# Patient Record
Sex: Male | Born: 1995
Health system: Southern US, Community
[De-identification: ages and names within clinical notes are randomized; demographics above are authoritative.]

## PROBLEM LIST (undated history)

## (undated) HISTORY — PX: TONSILLECTOMY: SUR1361

---
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
## 2013-02-08 ENCOUNTER — Encounter (HOSPITAL_BASED_OUTPATIENT_CLINIC_OR_DEPARTMENT_OTHER): Payer: Self-pay | Admitting: *Deleted

## 2013-02-08 ENCOUNTER — Emergency Department (HOSPITAL_BASED_OUTPATIENT_CLINIC_OR_DEPARTMENT_OTHER)
Admission: EM | Admit: 2013-02-08 | Discharge: 2013-02-08 | Disposition: A | Discharge status: Home / Self Care | Payer: Medicaid Other | Attending: Emergency Medicine | Admitting: Emergency Medicine

## 2013-02-08 ENCOUNTER — Emergency Department (HOSPITAL_BASED_OUTPATIENT_CLINIC_OR_DEPARTMENT_OTHER): Payer: Medicaid Other

## 2013-02-08 DIAGNOSIS — R296 Repeated falls: Secondary | ICD-10-CM | POA: Insufficient documentation

## 2013-02-08 DIAGNOSIS — Y9239 Other specified sports and athletic area as the place of occurrence of the external cause: Secondary | ICD-10-CM | POA: Insufficient documentation

## 2013-02-08 DIAGNOSIS — S59909A Unspecified injury of unspecified elbow, initial encounter: Secondary | ICD-10-CM | POA: Insufficient documentation

## 2013-02-08 DIAGNOSIS — S6990XA Unspecified injury of unspecified wrist, hand and finger(s), initial encounter: Secondary | ICD-10-CM | POA: Insufficient documentation

## 2013-02-08 DIAGNOSIS — T148XXA Other injury of unspecified body region, initial encounter: Secondary | ICD-10-CM

## 2013-02-08 DIAGNOSIS — S4991XA Unspecified injury of right shoulder and upper arm, initial encounter: Secondary | ICD-10-CM

## 2013-02-08 DIAGNOSIS — Y9367 Activity, basketball: Secondary | ICD-10-CM | POA: Insufficient documentation

## 2013-02-08 MED ORDER — HYDROCODONE-ACETAMINOPHEN 5-325 MG PO TABS: 1.0000 | ORAL_TABLET | ORAL | Status: DC | PRN

## 2013-02-08 MED ORDER — IBUPROFEN 800 MG PO TABS: 800.0000 mg | ORAL_TABLET | Freq: Three times a day (TID) | ORAL | Status: DC

## 2013-02-08 NOTE — ED Provider Notes (Signed)
TIME SEEN: 1:47 PM  CHIEF COMPLAINT: Right arm pain  HPI: Patient is an 17 year old male with no significant past medical history presents the emergency department with right arm injury. He states he was playing basketball at noon today when he stumbled and landed in the bleachers with his right arm. He initially had numbness of his right arm but that has resolved. He reports his pain is mostly in his right elbow and right proximal forearm but that it radiates into his fingertips. Describes the pain as throbbing, achy, moderate. Worse with flexion of the elbow. Better with ice. No focal weakness.  He did not hit his head. No neck pain or back pain.  ROS: See HPI Constitutional: no fever  Eyes: no drainage  ENT: no runny nose   Cardiovascular:  no chest pain  Resp: no SOB  GI: no vomiting GU: no dysuria Integumentary: no rash  Allergy: no hives  Musculoskeletal: no leg swelling  Neurological: no slurred speech ROS otherwise negative  PAST MEDICAL HISTORY/PAST SURGICAL HISTORY:  History reviewed. No pertinent past medical history.  MEDICATIONS:  Prior to Admission medications   Medication Sig Start Date End Date Taking? Authorizing Provider  diphenoxylate-atropine (LOMOTIL) 2.5-0.025 MG per tablet Take 2 tablets by mouth every 4 (four) hours as needed for diarrhea or loose stools. 11/30/12   Carleene Cooper III, MD  hydrOXYzine (ATARAX/VISTARIL) 25 MG tablet Take 1 or 2 tablets every 6 hours as needed for itching 03/03/12   Burgess Amor, PA-C  ketotifen (ALLERGY EYE DROPS) 0.025 % ophthalmic solution Place 1 drop into both eyes daily as needed. Allergies    Historical Provider, MD  predniSONE (DELTASONE) 10 MG tablet 6, 5, 4, 3, 2 then 1 tablet by mouth daily for 6 days total. 03/03/12   Burgess Amor, PA-C    ALLERGIES:  No Known Allergies  SOCIAL HISTORY:  History  Substance Use Topics  . Smoking status: Never Smoker   . Smokeless tobacco: Not on file  . Alcohol Use: No    FAMILY  HISTORY: Reviewed and not pertinent  EXAM: BP 132/97  Pulse 72  Temp(Src) 98.7 F (37.1 C) (Oral)  Resp 20  Wt 210 lb (95.255 kg)  BMI 31.94 kg/m2  SpO2 100% CONSTITUTIONAL: Alert and oriented and responds appropriately to questions. Well-appearing; well-nourished; GCS 15 HEAD: Normocephalic; atraumatic EYES: Conjunctivae clear, PERRL, EOMI ENT: normal nose; no rhinorrhea; moist mucous membranes; pharynx without lesions noted; no dental injury NECK: Supple, no meningismus, no LAD; no midline spinal tenderness, step-off or deformity CARD: RRR; S1 and S2 appreciated; no murmurs, no clicks, no rubs, no gallops RESP: Normal chest excursion without splinting or tachypnea; breath sounds clear and equal bilaterally; no wheezes, no rhonchi, no rales; chest wall stable, nontender to palpation ABD/GI: Normal bowel sounds; non-distended; soft, non-tender, no rebound, no guarding PELVIS:  stable, nontender to palpation BACK:  The back appears normal and is non-tender to palpation, there is no CVA tenderness; no midline spinal tenderness, step-off or deformity EXT: Tender to palpation over the proximal lateral right forearm and elbow with minimal amount of swelling, no ecchymosis, no joint effusion, Normal ROM in all joints; non-tender to palpation; no edema; normal capillary refill; no cyanosis; 2+ radial pulses bilaterally, sensation to light touch intact diffusely SKIN: Normal color for age and race; warm NEURO: Moves all extremities equally, normal gait, sensation to light touch intact diffusely, no facial droop, no slurred speech PSYCH: The patient's mood and manner are appropriate. Grooming and personal hygiene  are appropriate.  MEDICAL DECISION MAKING: Patient with injury to his right forearm and elbow. He is right-hand dominant. Neurovascular intact distally. Will obtain x-rays to evaluate for bony injury. Patient has full range of motion in all joints. No head injury.  ED PROGRESS: X-ray  shows no acute fracture. Discussed with patient this is likely contusion. He is neurovascularly intact currently. Has full range of motion in all joints. We'll discharge home with prescription for pain medication, instructions for rest, ice and elevation. Will give orthopedic followup as needed. Given usual and customary return precautions. Patient verbalized understanding and is comfortable with this plan.     Layla Maw Ward, DO 02/08/13 1424

## 2013-02-08 NOTE — ED Notes (Signed)
Was playing basketball and hit his right forearm on the bleachers. Arm has been painful and numb since.

## 2013-03-18 ENCOUNTER — Emergency Department (HOSPITAL_BASED_OUTPATIENT_CLINIC_OR_DEPARTMENT_OTHER)
Admission: EM | Admit: 2013-03-18 | Discharge: 2013-03-18 | Disposition: A | Discharge status: Home / Self Care | Payer: Medicaid Other | Attending: Emergency Medicine | Admitting: Emergency Medicine

## 2013-03-18 ENCOUNTER — Encounter (HOSPITAL_BASED_OUTPATIENT_CLINIC_OR_DEPARTMENT_OTHER): Payer: Self-pay | Admitting: Emergency Medicine

## 2013-03-18 DIAGNOSIS — R059 Cough, unspecified: Secondary | ICD-10-CM | POA: Insufficient documentation

## 2013-03-18 DIAGNOSIS — R51 Headache: Secondary | ICD-10-CM | POA: Insufficient documentation

## 2013-03-18 DIAGNOSIS — IMO0001 Reserved for inherently not codable concepts without codable children: Secondary | ICD-10-CM | POA: Insufficient documentation

## 2013-03-18 DIAGNOSIS — R05 Cough: Secondary | ICD-10-CM | POA: Insufficient documentation

## 2013-03-18 DIAGNOSIS — R509 Fever, unspecified: Secondary | ICD-10-CM | POA: Insufficient documentation

## 2013-03-18 DIAGNOSIS — J029 Acute pharyngitis, unspecified: Secondary | ICD-10-CM | POA: Insufficient documentation

## 2013-03-18 MED ORDER — IBUPROFEN 800 MG PO TABS
ORAL_TABLET | ORAL | Status: AC
  Filled 2013-03-18: qty 1

## 2013-03-18 MED ORDER — IBUPROFEN 800 MG PO TABS
800.0000 mg | ORAL_TABLET | Freq: Once | ORAL | Status: AC
  Administered 2013-03-18: 800 mg via ORAL

## 2013-03-18 NOTE — ED Notes (Signed)
Sore throat and fever x 24 hrs

## 2013-03-18 NOTE — ED Provider Notes (Signed)
CSN: [id number]     Arrival date & time 03/18/13  1858 History  This chart was scribed for Rolan Bucco, MD by Danella Maiers, ED Scribe. This patient was seen in room MH09/MH09 and the patient's care was started at 7:25 PM.   Chief Complaint  Patient presents with  . Sore Throat  . Fever   Patient is a 17 y.o. male presenting with pharyngitis and fever. The history is provided by the patient. No language interpreter was used.  Sore Throat This is a new problem. The current episode started 12 to 24 hours ago. The problem has been gradually worsening. Associated symptoms include headaches. Pertinent negatives include no chest pain, no abdominal pain and no shortness of breath. Nothing aggravates the symptoms.  Fever Associated symptoms: cough, headaches, myalgias and sore throat   Associated symptoms: no chest pain, no chills, no congestion, no diarrhea, no nausea, no rash, no rhinorrhea and no vomiting    HPI Comments: Ray Johnson is a 17 y.o. male who presents to the Emergency Department complaining of sore throat and fever for the past 24 hours with headache, myalgias, and mild cough. He states he has been feeling much worse since noon today and has been sleeping all day. His girlfriend has been sick with similar symptoms and was diagnosed with a URI.  Her symptoms have improved over the last few days.  He has not tried any OTC medications. He denies vomiting, diarrhea, rash, rhinorrhea, congestion.   History reviewed. No pertinent past medical history. Past Surgical History  Procedure Laterality Date  . Tonsillectomy     History reviewed. No pertinent family history. History  Substance Use Topics  . Smoking status: Never Smoker   . Smokeless tobacco: Not on file  . Alcohol Use: No    Review of Systems  Constitutional: Positive for fever. Negative for chills, diaphoresis and fatigue.  HENT: Positive for sore throat. Negative for congestion, rhinorrhea and sneezing.   Eyes:  Negative.   Respiratory: Positive for cough. Negative for chest tightness and shortness of breath.   Cardiovascular: Negative for chest pain and leg swelling.  Gastrointestinal: Negative for nausea, vomiting, abdominal pain, diarrhea and blood in stool.  Genitourinary: Negative for frequency, hematuria, flank pain and difficulty urinating.  Musculoskeletal: Positive for myalgias. Negative for arthralgias and back pain.  Skin: Negative for rash.  Neurological: Positive for headaches. Negative for dizziness, speech difficulty, weakness and numbness.    Allergies  Review of patient's allergies indicates no known allergies.  Home Medications  No current outpatient prescriptions on file. BP 119/78  Pulse 125  Temp(Src) 101.3 F (38.5 C) (Oral)  Resp 20  Wt 225 lb (102.059 kg)  SpO2 100% Physical Exam  Nursing note and vitals reviewed. Constitutional: He is oriented to person, place, and time. He appears well-developed and well-nourished.  HENT:  Head: Normocephalic and atraumatic.  Mouth/Throat: Uvula is midline.  Mild ery to posterior pharynx, no peritonsillar fullness  Eyes: Pupils are equal, round, and reactive to light.  Neck: Normal range of motion. Neck supple.  Cardiovascular: Normal rate, regular rhythm and normal heart sounds.   Pulmonary/Chest: Effort normal and breath sounds normal. No respiratory distress. He has no wheezes. He has no rales. He exhibits no tenderness.  Abdominal: Soft. Bowel sounds are normal. There is no tenderness. There is no rebound and no guarding.  Musculoskeletal: Normal range of motion. He exhibits no edema.  Lymphadenopathy:    He has no cervical adenopathy.  Neurological: He  is alert and oriented to person, place, and time.  Skin: Skin is warm and dry. No rash noted.  Psychiatric: He has a normal mood and affect.    ED Course  Procedures (including critical care time) Medications  ibuprofen (ADVIL,MOTRIN) tablet 800 mg (800 mg Oral Given  03/18/13 1942)   DIAGNOSTIC STUDIES: Oxygen Saturation is 100% on RA, normal by my interpretation.    COORDINATION OF CARE: 7:47 PM- Discussed treatment plan with pt which includes advising pt to alternate tylenol and motrin. Pt agrees to plan.    Labs Review Labs Reviewed  RAPID STREP SCREEN  CULTURE, GROUP A STREP   Imaging Review No results found.  EKG Interpretation   None       MDM   1. Pharyngitis    Patient is well-appearing. His symptoms are suggestive of pharyngitis. His rapid strep was negative likely indicating a viral infection however a culture was sent. He is still mildly tachycardic with a heart rate of 1:15 however I think that this is likely explained from the fever. He has no vomiting or signs of dehydration. He is well-appearing and there is no evidence suggestive of meningitis. He has no significant cough shortness of breath or hypoxia that would be suggestive of pneumonia. He was advised in symptomatic care and was advised to return if his symptoms worsen otherwise to followup with his primary care physician as needed     I personally performed the services described in this documentation, which was scribed in my presence.  The recorded information has been reviewed and considered.    Rolan Bucco, MD 03/18/13 2001

## 2013-03-20 LAB — CULTURE, GROUP A STREP

## 2013-07-23 ENCOUNTER — Encounter (HOSPITAL_BASED_OUTPATIENT_CLINIC_OR_DEPARTMENT_OTHER): Payer: Self-pay | Admitting: Emergency Medicine

## 2013-07-23 ENCOUNTER — Emergency Department (HOSPITAL_BASED_OUTPATIENT_CLINIC_OR_DEPARTMENT_OTHER): Payer: Medicaid Other

## 2013-07-23 ENCOUNTER — Emergency Department (HOSPITAL_BASED_OUTPATIENT_CLINIC_OR_DEPARTMENT_OTHER)
Admission: EM | Admit: 2013-07-23 | Discharge: 2013-07-23 | Disposition: A | Discharge status: Home / Self Care | Payer: Medicaid Other | Attending: Emergency Medicine | Admitting: Emergency Medicine

## 2013-07-23 DIAGNOSIS — X58XXXA Exposure to other specified factors, initial encounter: Secondary | ICD-10-CM | POA: Insufficient documentation

## 2013-07-23 DIAGNOSIS — F17200 Nicotine dependence, unspecified, uncomplicated: Secondary | ICD-10-CM | POA: Insufficient documentation

## 2013-07-23 DIAGNOSIS — Y939 Activity, unspecified: Secondary | ICD-10-CM | POA: Insufficient documentation

## 2013-07-23 DIAGNOSIS — S39012A Strain of muscle, fascia and tendon of lower back, initial encounter: Secondary | ICD-10-CM

## 2013-07-23 DIAGNOSIS — Y929 Unspecified place or not applicable: Secondary | ICD-10-CM | POA: Insufficient documentation

## 2013-07-23 DIAGNOSIS — S335XXA Sprain of ligaments of lumbar spine, initial encounter: Secondary | ICD-10-CM | POA: Insufficient documentation

## 2013-07-23 NOTE — ED Provider Notes (Signed)
CSN: [id number]     Arrival date & time 07/23/13  1817 History   First MD Initiated Contact with Patient 07/23/13 1915     Chief Complaint  Patient presents with  . Back Pain     (Consider location/radiation/quality/duration/timing/severity/associated sxs/prior Treatment) HPI Comments: Patient here with mid to lower back pain - states pain started on Monday and has progressively gotten worse - saw his PCP yesterday and was started on Mobic and flexeril which he does not believe is helping his pain - he denies fever, chills, injury to the back, weakness, numbness, tingling, loss of control of bowel or bladder.  Patient is a 18 y.o. male presenting with back pain. The history is provided by the patient. No language interpreter was used.  Back Pain Location:  Lumbar spine Quality:  Aching Radiates to:  Does not radiate Pain severity:  Moderate Pain is:  Same all the time Onset quality:  Gradual Duration:  5 days Timing:  Constant Progression:  Worsening Chronicity:  New Context: not jumping from heights, not lifting heavy objects, not occupational injury, not pedestrian accident, not physical stress, not recent illness and not twisting   Relieved by:  Nothing Worsened by:  Movement Ineffective treatments:  Ibuprofen and muscle relaxants Associated symptoms: no abdominal pain, no bladder incontinence, no bowel incontinence, no dysuria, no fever, no headaches, no leg pain, no pelvic pain, no tingling, no weakness and no weight loss     History reviewed. No pertinent past medical history. Past Surgical History  Procedure Laterality Date  . Tonsillectomy     No family history on file. History  Substance Use Topics  . Smoking status: Current Some Day Smoker  . Smokeless tobacco: Not on file  . Alcohol Use: No    Review of Systems  Constitutional: Negative for fever and weight loss.  Gastrointestinal: Negative for abdominal pain and bowel incontinence.  Genitourinary: Negative  for bladder incontinence, dysuria and pelvic pain.  Musculoskeletal: Positive for back pain.  Neurological: Negative for tingling, weakness and headaches.  All other systems reviewed and are negative.      Allergies  Review of patient's allergies indicates no known allergies.  Home Medications  No current outpatient prescriptions on file. BP 130/87  Pulse 80  Temp(Src) 98 F (36.7 C)  Resp 18  Ht 5\' 7"  (1.702 m)  Wt 220 lb (99.791 kg)  BMI 34.45 kg/m2  SpO2 99% Physical Exam  Nursing note and vitals reviewed. Constitutional: He is oriented to person, place, and time. He appears well-developed and well-nourished. No distress.  HENT:  Head: Normocephalic and atraumatic.  Mouth/Throat: Oropharynx is clear and moist.  Eyes: Conjunctivae are normal. No scleral icterus.  Neck: Normal range of motion. Neck supple. No spinous process tenderness and no muscular tenderness present.  Pulmonary/Chest: Effort normal.  Musculoskeletal: Normal range of motion.       Thoracic back: He exhibits normal range of motion, no tenderness and no bony tenderness.       Lumbar back: He exhibits tenderness and bony tenderness. He exhibits normal range of motion, no pain and no spasm.       Back:  Lymphadenopathy:    He has no cervical adenopathy.  Neurological: He is alert and oriented to person, place, and time. He displays no atrophy. He exhibits normal muscle tone. He displays a negative Romberg sign. Coordination and gait normal. He displays no Babinski's sign on the right side. He displays no Babinski's sign on the left side.  Reflex Scores:      Patellar reflexes are 2+ on the right side and 2+ on the left side.      Achilles reflexes are 2+ on the right side and 2+ on the left side. 5/5 strength in bilateral hip flexors, quads, hamstrings  Skin: Skin is warm and dry. No rash noted. No erythema. No pallor.  Psychiatric: He has a normal mood and affect. His behavior is normal. Judgment and  thought content normal.    ED Course  Procedures (including critical care time) Labs Review Labs Reviewed - No data to display Imaging Review Dg Lumbar Spine Complete  07/23/2013   CLINICAL DATA:  Low back pain  EXAM: LUMBAR SPINE - COMPLETE 4+ VIEW  COMPARISON:  March 30, 2011  FINDINGS: Frontal, lateral, spot lumbosacral lateral, and bilateral oblique views were obtained. There are 5 non-rib-bearing lumbar type vertebral bodies. There is no fracture or spondylolisthesis. Disc spaces appear intact. There are pars defects at L4 bilaterally. There is no appreciable facet arthropathy.  IMPRESSION: Pars defects at L4 bilaterally. No fracture or spondylolisthesis. No appreciable arthropathy.   Electronically Signed   By: Bretta BangWilliam  Woodruff M.D.   On: 07/23/2013 19:59     EKG Interpretation None      MDM   Lower back pain  Patient here with lower back pain - has been seeing PCP for this - x-ray shows bilateral pars defects wtihout spondylolisthesis, results discussed with the patient, believe this to be more muscular in nature.  No alarming signs to suggest cauda equina or epidural hematoma.   Izola PriceFrances C. Marisue HumbleSanford, New JerseyPA-C 07/23/13 2113

## 2013-07-23 NOTE — Discharge Instructions (Signed)
Back Exercises °Back exercises help treat and prevent back injuries. The goal of back exercises is to increase the strength of your abdominal and back muscles and the flexibility of your back. These exercises should be started when you no longer have back pain. Back exercises include: °· Pelvic Tilt. Lie on your back with your knees bent. Tilt your pelvis until the lower part of your back is against the floor. Hold this position 5 to 10 sec and repeat 5 to 10 times. °· Knee to Chest. Pull first 1 knee up against your chest and hold for 20 to 30 seconds, repeat this with the other knee, and then both knees. This may be done with the other leg straight or bent, whichever feels better. °· Sit-Ups or Curl-Ups. Bend your knees 90 degrees. Start with tilting your pelvis, and do a partial, slow sit-up, lifting your trunk only 30 to 45 degrees off the floor. Take at least 2 to 3 seconds for each sit-up. Do not do sit-ups with your knees out straight. If partial sit-ups are difficult, simply do the above but with only tightening your abdominal muscles and holding it as directed. °· Hip-Lift. Lie on your back with your knees flexed 90 degrees. Push down with your feet and shoulders as you raise your hips a couple inches off the floor; hold for 10 seconds, repeat 5 to 10 times. °· Back arches. Lie on your stomach, propping yourself up on bent elbows. Slowly press on your hands, causing an arch in your low back. Repeat 3 to 5 times. Any initial stiffness and discomfort should lessen with repetition over time. °· Shoulder-Lifts. Lie face down with arms beside your body. Keep hips and torso pressed to floor as you slowly lift your head and shoulders off the floor. °Do not overdo your exercises, especially in the beginning. Exercises may cause you some mild back discomfort which lasts for a few minutes; however, if the pain is more severe, or lasts for more than 15 minutes, do not continue exercises until you see your caregiver.  Improvement with exercise therapy for back problems is slow.  °See your caregivers for assistance with developing a proper back exercise program. °Document Released: 06/05/2004 Document Revised: 07/21/2011 Document Reviewed: 02/27/2011 °[employer]® Patient Information ©2014 [employer]. ° °Back Pain, Adult °Low back pain is very common. About 1 in 5 people have back pain. The cause of low back pain is rarely dangerous. The pain often gets better over time. About half of people with a sudden onset of back pain feel better in just 2 weeks. About 8 in 10 people feel better by 6 weeks.  °CAUSES °Some common causes of back pain include: °· Strain of the muscles or ligaments supporting the spine. °· Wear and tear (degeneration) of the spinal discs. °· Arthritis. °· Direct injury to the back. °DIAGNOSIS °Most of the time, the direct cause of low back pain is not known. However, back pain can be treated effectively even when the exact cause of the pain is unknown. Answering your caregiver's questions about your overall health and symptoms is one of the most accurate ways to make sure the cause of your pain is not dangerous. If your caregiver needs more information, he or she may order lab work or imaging tests (X-rays or MRIs). However, even if imaging tests show changes in your back, this usually does not require surgery. °HOME CARE INSTRUCTIONS °For many people, back pain returns. Since low back pain is rarely dangerous, it is often a condition that people   can learn to manage on their own.  °· Remain active. It is stressful on the back to sit or stand in one place. Do not sit, drive, or stand in one place for more than 30 minutes at a time. Take short walks on level surfaces as soon as pain allows. Try to increase the length of time you walk each day. °· Do not stay in bed. Resting more than 1 or 2 days can delay your recovery. °· Do not avoid exercise or work. Your body is made to move. It is not dangerous to be active,  even though your back may hurt. Your back will likely heal faster if you return to being active before your pain is gone. °· Pay attention to your body when you  bend and lift. Many people have less discomfort when lifting if they bend their knees, keep the load close to their bodies, and avoid twisting. Often, the most comfortable positions are those that put less stress on your recovering back. °· Find a comfortable position to sleep. Use a firm mattress and lie on your side with your knees slightly bent. If you lie on your back, put a pillow under your knees. °· Only take over-the-counter or prescription medicines as directed by your caregiver. Over-the-counter medicines to reduce pain and inflammation are often the most helpful. Your caregiver may prescribe muscle relaxant drugs. These medicines help dull your pain so you can more quickly return to your normal activities and healthy exercise. °· Put ice on the injured area. °· Put ice in a plastic bag. °· Place a towel between your skin and the bag. °· Leave the ice on for 15-20 minutes, 03-04 times a day for the first 2 to 3 days. After that, ice and heat may be alternated to reduce pain and spasms. °· Ask your caregiver about trying back exercises and gentle massage. This may be of some benefit. °· Avoid feeling anxious or stressed. Stress increases muscle tension and can worsen back pain. It is important to recognize when you are anxious or stressed and learn ways to manage it. Exercise is a great option. °SEEK MEDICAL CARE IF: °· You have pain that is not relieved with rest or medicine. °· You have pain that does not improve in 1 week. °· You have new symptoms. °· You are generally not feeling well. °SEEK IMMEDIATE MEDICAL CARE IF:  °· You have pain that radiates from your back into your legs. °· You develop new bowel or bladder control problems. °· You have unusual weakness or numbness in your arms or legs. °· You develop nausea or vomiting. °· You develop  abdominal pain. °· You feel faint. °Document Released: 04/28/2005 Document Revised: 10/28/2011 Document Reviewed: 09/16/2010 °Exit[employer]® Patient Information ©2014 [employer]. ° °Lumbosacral Strain °Lumbosacral strain is a strain of any of the parts that make up your lumbosacral vertebrae. Your lumbosacral vertebrae are the bones that make up the lower third of your backbone. Your lumbosacral vertebrae are held together by muscles and tough, fibrous tissue (ligaments).  °CAUSES  °A sudden blow to your back can cause lumbosacral strain. Also, anything that causes an excessive stretch of the muscles in the low back can cause this strain. This is typically seen when people exert themselves strenuously, fall, lift heavy objects, bend, or crouch repeatedly. °RISK FACTORS °· Physically demanding work. °· Participation in pushing or pulling sports or sports that require sudden twist of the back (tennis, golf, baseball). °· Weight lifting. °· Excessive lower back curvature. °· Forward-tilted pelvis. °· Weak back or abdominal muscles or both. °·   Tight hamstrings. °SIGNS AND SYMPTOMS  °Lumbosacral strain may cause pain in the area of your injury or pain that moves (radiates) down your leg.  °DIAGNOSIS °Your health care provider can often diagnose lumbosacral strain through a physical exam. In some cases, you may need tests such as X-ray exams.  °TREATMENT  °Treatment for your lower back injury depends on many factors that your clinician will have to evaluate. However, most treatment will include the use of anti-inflammatory medicines. °HOME CARE INSTRUCTIONS  °· Avoid hard physical activities (tennis, racquetball, waterskiing) if you are not in proper physical condition for it. This may aggravate or create problems. °· If you have a back problem, avoid sports requiring sudden body movements. Swimming and walking are generally safer activities. °· Maintain good posture. °· Maintain a healthy weight. °· For acute conditions,  you may put ice on the injured area. °· Put ice in a plastic bag. °· Place a towel between your skin and the bag. °· Leave the ice on for 20 minutes, 2 3 times a day. °· When the low back starts healing, stretching and strengthening exercises may be recommended. °SEEK MEDICAL CARE IF: °· Your back pain is getting worse. °· You experience severe back pain not relieved with medicines. °SEEK IMMEDIATE MEDICAL CARE IF:  °· You have numbness, tingling, weakness, or problems with the use of your arms or legs. °· There is a change in bowel or bladder control. °· You have increasing pain in any area of the body, including your belly (abdomen). °· You notice shortness of breath, dizziness, or feel faint. °· You feel sick to your stomach (nauseous), are throwing up (vomiting), or become sweaty. °· You notice discoloration of your toes or legs, or your feet get very cold. °MAKE SURE YOU:  °· Understand these instructions. °· Will watch your condition. °· Will get help right away if you are not doing well or get worse. °Document Released: 02/05/2005 Document Revised: 02/16/2013 Document Reviewed: 12/15/2012 °ExitCare® Patient Information ©2014 [employer]. ° °

## 2013-07-23 NOTE — ED Notes (Signed)
C/o mid/low back pain since Monday.  States pain is radiating up his spine into his chest.  Denies chest pain.  Does c/o some shortness of breath.  Denies prior history of an episode like this, does not recall an injury or causative factor for his back pain.

## 2013-07-24 NOTE — ED Provider Notes (Signed)
Medical screening examination/treatment/procedure(s) were performed by non-physician practitioner and as supervising physician I was immediately available for consultation/collaboration.   EKG Interpretation None        Audree CamelScott T Mare Ludtke, MD 07/24/13 1201

## 2014-01-21 ENCOUNTER — Encounter (HOSPITAL_BASED_OUTPATIENT_CLINIC_OR_DEPARTMENT_OTHER): Payer: Self-pay | Admitting: Emergency Medicine

## 2014-01-21 ENCOUNTER — Emergency Department (HOSPITAL_BASED_OUTPATIENT_CLINIC_OR_DEPARTMENT_OTHER)
Admission: EM | Admit: 2014-01-21 | Discharge: 2014-01-21 | Disposition: A | Discharge status: Home / Self Care | Payer: Medicaid Other | Attending: Emergency Medicine | Admitting: Emergency Medicine

## 2014-01-21 DIAGNOSIS — S3981XA Other specified injuries of abdomen, initial encounter: Secondary | ICD-10-CM | POA: Insufficient documentation

## 2014-01-21 DIAGNOSIS — Y9239 Other specified sports and athletic area as the place of occurrence of the external cause: Secondary | ICD-10-CM | POA: Insufficient documentation

## 2014-01-21 DIAGNOSIS — Y9364 Activity, baseball: Secondary | ICD-10-CM | POA: Diagnosis not present

## 2014-01-21 DIAGNOSIS — Y92838 Other recreation area as the place of occurrence of the external cause: Secondary | ICD-10-CM | POA: Diagnosis not present

## 2014-01-21 DIAGNOSIS — F17200 Nicotine dependence, unspecified, uncomplicated: Secondary | ICD-10-CM | POA: Insufficient documentation

## 2014-01-21 DIAGNOSIS — W219XXA Striking against or struck by unspecified sports equipment, initial encounter: Secondary | ICD-10-CM | POA: Insufficient documentation

## 2014-01-21 DIAGNOSIS — Z79899 Other long term (current) drug therapy: Secondary | ICD-10-CM | POA: Diagnosis not present

## 2014-01-21 DIAGNOSIS — Z791 Long term (current) use of non-steroidal anti-inflammatories (NSAID): Secondary | ICD-10-CM | POA: Insufficient documentation

## 2014-01-21 DIAGNOSIS — S76211S Strain of adductor muscle, fascia and tendon of right thigh, sequela: Secondary | ICD-10-CM

## 2014-01-21 DIAGNOSIS — IMO0002 Reserved for concepts with insufficient information to code with codable children: Secondary | ICD-10-CM | POA: Insufficient documentation

## 2014-01-21 MED ORDER — NAPROXEN 500 MG PO TABS: 500.0000 mg | ORAL_TABLET | Freq: Two times a day (BID) | ORAL | Status: DC

## 2014-01-21 MED ORDER — METHOCARBAMOL 500 MG PO TABS: 500.0000 mg | ORAL_TABLET | Freq: Two times a day (BID) | ORAL | Status: DC

## 2014-01-21 NOTE — ED Provider Notes (Signed)
CSN: [id number]     Arrival date & time 01/21/14  1354 History   This chart was scribed for Rolland Porter, MD by Modena Jansky, ED Scribe. This patient was seen in room MH11/MH11 and the patient's care was started at 2:55 PM.   Chief Complaint  Patient presents with  . Groin Pain   The history is provided by the patient. No language interpreter was used.   HPI Comments: Ray Johnson is a 18 y.o. male who presents to the Emergency Department complaining of constant moderate right groin pain that started this morning. He states that he was playing softball last night aran from 2nd to third base.  Felt a pull and pain in his right groin, up toward his right testicle. Limping today.  No fall or slide.  History reviewed. No pertinent past medical history. Past Surgical History  Procedure Laterality Date  . Tonsillectomy     No family history on file. History  Substance Use Topics  . Smoking status: Current Some Day Smoker  . Smokeless tobacco: Not on file  . Alcohol Use: No    Review of Systems  Constitutional: Negative for fever, chills, diaphoresis, appetite change and fatigue.  HENT: Negative for mouth sores, sore throat and trouble swallowing.   Eyes: Negative for visual disturbance.  Respiratory: Negative for cough, chest tightness, shortness of breath and wheezing.   Cardiovascular: Negative for chest pain.  Gastrointestinal: Negative for nausea, vomiting, abdominal pain, diarrhea and abdominal distention.  Endocrine: Negative for polydipsia, polyphagia and polyuria.  Genitourinary: Negative for dysuria, frequency and hematuria.  Musculoskeletal: Positive for myalgias. Negative for gait problem.  Skin: Negative for color change, pallor and rash.  Neurological: Negative for dizziness, syncope, light-headedness and headaches.  Hematological: Does not bruise/bleed easily.  Psychiatric/Behavioral: Negative for behavioral problems and confusion.    Allergies  Review of  patient's allergies indicates no known allergies.  Home Medications   Prior to Admission medications   Medication Sig Start Date End Date Taking? Authorizing Provider  methocarbamol (ROBAXIN) 500 MG tablet Take 1 tablet (500 mg total) by mouth 2 (two) times daily. 01/21/14   Rolland Porter, MD  naproxen (NAPROSYN) 500 MG tablet Take 1 tablet (500 mg total) by mouth 2 (two) times daily. 01/21/14   Rolland Porter, MD   BP 124/74  Temp(Src) 98 F (36.7 C)  Resp 16  Ht  (1.676 m)  Wt 225 lb (102.059 kg)  BMI 36.33 kg/m2  SpO2 100% Physical Exam  Nursing note and vitals reviewed. Constitutional: He is oriented to person, place, and time. He appears well-developed and well-nourished. No distress.  HENT:  Head: Normocephalic.  Eyes: Conjunctivae are normal. Pupils are equal, round, and reactive to light. No scleral icterus.  Neck: Normal range of motion. Neck supple. No thyromegaly present.  Cardiovascular: Normal rate and regular rhythm.  Exam reveals no gallop and no friction rub.   No murmur heard. Pulmonary/Chest: Effort normal and breath sounds normal. No respiratory distress. He has no wheezes. He has no rales.  Abdominal: Soft. Bowel sounds are normal. He exhibits no distension. There is no tenderness. There is no rebound.  Musculoskeletal: Normal range of motion.  Tenderness along adductors, to  Pubic symphysis.  No ecymosis.  Pain with resisted aDduction, and with passive aBduction.    Normal scrotum and testicle.  Neurological: He is alert and oriented to person, place, and time.  Skin: Skin is warm and dry. No rash noted.  Psychiatric: He has a  normal mood and affect. His behavior is normal.    ED Course  Procedures (including critical care time) DIAGNOSTIC STUDIES: Oxygen Saturation is 100% on RA, normal by my interpretation.    COORDINATION OF CARE: 2:55 PM- Pt advised of plan for treatment and pt agrees.  Labs Review Labs Reviewed - No data to display  Imaging  Review No results found.   EKG Interpretation None      MDM   Final diagnoses:  Strain of adductor magnus muscle, right, sequela    I personally performed the services described in this documentation, which was scribed in my presence. The recorded information has been reviewed and is accurate.     Rolland Porter, MD 01/25/14 1455

## 2014-01-21 NOTE — Discharge Instructions (Signed)
Gentle stretching.    Avoid strenuous activity, or sports until pain improved.

## 2014-01-21 NOTE — ED Notes (Signed)
Pt reports playing softball last night, slid in to third base and today woke up with pain to right groin area. Unsure if pulled muscle.

## 2014-08-03 ENCOUNTER — Emergency Department (HOSPITAL_BASED_OUTPATIENT_CLINIC_OR_DEPARTMENT_OTHER)
Admission: EM | Admit: 2014-08-03 | Discharge: 2014-08-03 | Disposition: A | Discharge status: Home / Self Care | Payer: Medicaid Other | Attending: Emergency Medicine | Admitting: Emergency Medicine

## 2014-08-03 ENCOUNTER — Encounter (HOSPITAL_BASED_OUTPATIENT_CLINIC_OR_DEPARTMENT_OTHER): Payer: Self-pay | Admitting: Emergency Medicine

## 2014-08-03 DIAGNOSIS — Z79899 Other long term (current) drug therapy: Secondary | ICD-10-CM | POA: Insufficient documentation

## 2014-08-03 DIAGNOSIS — Z720 Tobacco use: Secondary | ICD-10-CM | POA: Diagnosis not present

## 2014-08-03 DIAGNOSIS — R1084 Generalized abdominal pain: Secondary | ICD-10-CM | POA: Diagnosis present

## 2014-08-03 DIAGNOSIS — K529 Noninfective gastroenteritis and colitis, unspecified: Secondary | ICD-10-CM | POA: Diagnosis not present

## 2014-08-03 DIAGNOSIS — Z791 Long term (current) use of non-steroidal anti-inflammatories (NSAID): Secondary | ICD-10-CM | POA: Insufficient documentation

## 2014-08-03 LAB — BASIC METABOLIC PANEL
ANION GAP: 7 (ref 5–15)
BUN: 16 mg/dL (ref 6–23)
CHLORIDE: 107 mmol/L (ref 96–112)
CO2: 25 mmol/L (ref 19–32)
CREATININE: 0.95 mg/dL (ref 0.50–1.35)
Calcium: 9 mg/dL (ref 8.4–10.5)
GFR calc Af Amer: >90 mL/min (ref >90)
GFR calc non Af Amer: >90 mL/min (ref >90)
Glucose, Bld: 105 mg/dL — ABNORMAL HIGH (ref 70–99)
Potassium: 4.1 mmol/L (ref 3.5–5.1)
Sodium: 139 mmol/L (ref 135–145)

## 2014-08-03 LAB — CBC WITH DIFFERENTIAL/PLATELET
BASOS PCT: 0 % (ref 0–1)
Basophils Absolute: 0 10*3/uL (ref 0.0–0.1)
Eosinophils Absolute: 0.3 10*3/uL (ref 0.0–0.7)
Eosinophils Relative: 4 % (ref 0–5)
HEMATOCRIT: 43.7 % (ref 39.0–52.0)
HEMOGLOBIN: 14.7 g/dL (ref 13.0–17.0)
LYMPHS ABS: 2.2 10*3/uL (ref 0.7–4.0)
LYMPHS PCT: 27 % (ref 12–46)
MCH: 29.3 pg (ref 26.0–34.0)
MCHC: 33.6 g/dL (ref 30.0–36.0)
MCV: 87.1 fL (ref 78.0–100.0)
MONO ABS: 1.3 10*3/uL — AB (ref 0.1–1.0)
MONOS PCT: 17 % — AB (ref 3–12)
NEUTROS ABS: 4.1 10*3/uL (ref 1.7–7.7)
NEUTROS PCT: 52 % (ref 43–77)
Platelets: 244 10*3/uL (ref 150–400)
RBC: 5.02 MIL/uL (ref 4.22–5.81)
RDW: 12.6 % (ref 11.5–15.5)
WBC: 7.9 10*3/uL (ref 4.0–10.5)

## 2014-08-03 MED ORDER — ONDANSETRON 8 MG PO TBDP: ORAL_TABLET | ORAL | Status: DC

## 2014-08-03 MED ORDER — KETOROLAC TROMETHAMINE 30 MG/ML IJ SOLN
30.0000 mg | Freq: Once | INTRAMUSCULAR | Status: AC
  Administered 2014-08-03: 30 mg via INTRAVENOUS
  Filled 2014-08-03: qty 1

## 2014-08-03 MED ORDER — ONDANSETRON HCL 4 MG/2ML IJ SOLN
4.0000 mg | Freq: Once | INTRAMUSCULAR | Status: AC
  Administered 2014-08-03: 4 mg via INTRAVENOUS
  Filled 2014-08-03: qty 2

## 2014-08-03 MED ORDER — SODIUM CHLORIDE 0.9 % IV BOLUS (SEPSIS)
1000.0000 mL | Freq: Once | INTRAVENOUS | Status: AC
  Administered 2014-08-03: 1000 mL via INTRAVENOUS

## 2014-08-03 NOTE — Discharge Instructions (Signed)
Zofran as prescribed as needed for nausea. ° °Clear liquid diet for the next 12 hours, then slowly advance to normal. ° °Return to the emergency department for severe abdominal pain, bloody stool, or other new and concerning symptoms. ° ° °Viral Gastroenteritis °Viral gastroenteritis is also known as stomach flu. This condition affects the stomach and intestinal tract. It can cause sudden diarrhea and vomiting. The illness typically lasts 3 to 8 days. Most people develop an immune response that eventually gets rid of the virus. While this natural response develops, the virus can make you quite ill. °CAUSES  °Many different viruses can cause gastroenteritis, such as rotavirus or noroviruses. You can catch one of these viruses by consuming contaminated food or water. You may also catch a virus by sharing utensils or other personal items with an infected person or by touching a contaminated surface. °SYMPTOMS  °The most common symptoms are diarrhea and vomiting. These problems can cause a severe loss of body fluids (dehydration) and a body salt (electrolyte) imbalance. Other symptoms may include: °· Fever. °· Headache. °· Fatigue. °· Abdominal pain. °DIAGNOSIS  °Your caregiver can usually diagnose viral gastroenteritis based on your symptoms and a physical exam. A stool sample may also be taken to test for the presence of viruses or other infections. °TREATMENT  °This illness typically goes away on its own. Treatments are aimed at rehydration. The most serious cases of viral gastroenteritis involve vomiting so severely that you are not able to keep fluids down. In these cases, fluids must be given through an intravenous line (IV). °HOME CARE INSTRUCTIONS  °· Drink enough fluids to keep your urine clear or pale yellow. Drink small amounts of fluids frequently and increase the amounts as tolerated. °· Ask your caregiver for specific rehydration instructions. °· Avoid: °¨ Foods high in sugar. °¨ Alcohol. °¨ Carbonated  drinks. °¨ Tobacco. °¨ Juice. °¨ Caffeine drinks. °¨ Extremely hot or cold fluids. °¨ Fatty, greasy foods. °¨ Too much intake of anything at one time. °¨ Dairy products until 24 to 48 hours after diarrhea stops. °· You may consume probiotics. Probiotics are active cultures of beneficial bacteria. They may lessen the amount and number of diarrheal stools in adults. Probiotics can be found in yogurt with active cultures and in supplements. °· Wash your hands well to avoid spreading the virus. °· Only take over-the-counter or prescription medicines for pain, discomfort, or fever as directed by your caregiver. Do not give aspirin to children. Antidiarrheal medicines are not recommended. °· Ask your caregiver if you should continue to take your regular prescribed and over-the-counter medicines. °· Keep all follow-up appointments as directed by your caregiver. °SEEK IMMEDIATE MEDICAL CARE IF:  °· You are unable to keep fluids down. °· You do not urinate at least once every 6 to 8 hours. °· You develop shortness of breath. °· You notice blood in your stool or vomit. This may look like coffee grounds. °· You have abdominal pain that increases or is concentrated in one small area (localized). °· You have persistent vomiting or diarrhea. °· You have a fever. °· The patient is a child younger than 3 months, and he or she has a fever. °· The patient is a child older than 3 months, and he or she has a fever and persistent symptoms. °· The patient is a child older than 3 months, and he or she has a fever and symptoms suddenly get worse. °· The patient is a baby, and he or she has   no tears when crying. °MAKE SURE YOU:  °· Understand these instructions. °· Will watch your condition. °· Will get help right away if you are not doing well or get worse. °Document Released: 04/28/2005 Document Revised: 07/21/2011 Document Reviewed: 02/12/2011 °[employer]® Patient Information ©2015 [employer]. This information is not intended to replace  advice given to you by your health care provider. Make sure you discuss any questions you have with your health care provider. ° °

## 2014-08-03 NOTE — ED Provider Notes (Signed)
CSN: [id number]     Arrival date & time 08/03/14  0650 History   First MD Initiated Contact with Patient 08/03/14 0715     Chief Complaint  Patient presents with  . Abdominal Pain     (Consider location/radiation/quality/duration/timing/severity/associated sxs/prior Treatment) HPI Comments: Patient presents with a 2 day history of nausea, vomiting, diarrhea. Several family members are ill in a similar fashion. He is also reporting some abdominal cramping but denies fever. All stool has been nonbloody.  Patient is a 19 y.o. male presenting with abdominal pain. The history is provided by the patient.  Abdominal Pain Pain location:  Generalized Pain quality: cramping   Pain radiates to:  Does not radiate Pain severity:  Moderate Onset quality:  Sudden Duration:  2 days Timing:  Constant Progression:  Worsening Chronicity:  New Relieved by:  Nothing Worsened by:  Nothing tried Ineffective treatments:  None tried   History reviewed. No pertinent past medical history. Past Surgical History  Procedure Laterality Date  . Tonsillectomy     History reviewed. No pertinent family history. History  Substance Use Topics  . Smoking status: Current Some Day Smoker  . Smokeless tobacco: Not on file  . Alcohol Use: No    Review of Systems  Gastrointestinal: Positive for abdominal pain.  All other systems reviewed and are negative.     Allergies  Review of patient's allergies indicates no known allergies.  Home Medications   Prior to Admission medications   Medication Sig Start Date End Date Taking? Authorizing Provider  methocarbamol (ROBAXIN) 500 MG tablet Take 1 tablet (500 mg total) by mouth 2 (two) times daily. 01/21/14   Rolland PorterMark James, MD  naproxen (NAPROSYN) 500 MG tablet Take 1 tablet (500 mg total) by mouth 2 (two) times daily. 01/21/14   Rolland PorterMark James, MD   BP 140/73 mmHg  Pulse 73  Temp(Src) 98.1 F (36.7 C) (Oral)  Resp 16  Ht 5\' 7"  (1.702 m)  Wt 225 lb (102.059 kg)   BMI 35.23 kg/m2  SpO2 99% Physical Exam  Constitutional: He is oriented to person, place, and time. He appears well-developed and well-nourished. No distress.  HENT:  Head: Normocephalic and atraumatic.  Mouth/Throat: Oropharynx is clear and moist.  Neck: Normal range of motion. Neck supple.  Cardiovascular: Normal rate, regular rhythm and normal heart sounds.   No murmur heard. Pulmonary/Chest: Effort normal and breath sounds normal. No respiratory distress. He has no wheezes.  Abdominal: Soft. Bowel sounds are normal. He exhibits no distension. There is no tenderness.  Musculoskeletal: Normal range of motion. He exhibits no edema.  Neurological: He is alert and oriented to person, place, and time.  Skin: Skin is warm and dry. He is not diaphoretic.  Nursing note and vitals reviewed.   ED Course  Procedures (including critical care time) Labs Review Labs Reviewed  BASIC METABOLIC PANEL  CBC WITH DIFFERENTIAL/PLATELET    Imaging Review No results found.   EKG Interpretation None      MDM   Final diagnoses:  None    Patient's presentation, exam, and workup consistent with a viral gastroenteritis. He was hydrated and medicated and is feeling better. He will be discharged home with nausea medication and when necessary return.    Geoffery Lyonsouglas Leanna Hamid, MD 08/03/14 [phone]

## 2014-08-03 NOTE — ED Notes (Signed)
Pt reports + nausea, vomiting and diarrhea since tues, entire family + stomach bug, today pt unable to keep fluids in

## 2014-08-03 NOTE — ED Notes (Signed)
Pt resting quietly in nad. Iv fluids are infusing slowly. Dr. Judd Liendelo requests pt receive entire bag before d/c home, pt is aware of plan and will call this rn when fluids are complete.

## 2014-08-03 NOTE — ED Notes (Signed)
MD at bedside. 

## 2015-10-29 ENCOUNTER — Encounter (HOSPITAL_BASED_OUTPATIENT_CLINIC_OR_DEPARTMENT_OTHER): Payer: Self-pay | Admitting: Emergency Medicine

## 2015-10-29 ENCOUNTER — Emergency Department (HOSPITAL_BASED_OUTPATIENT_CLINIC_OR_DEPARTMENT_OTHER)
Admission: EM | Admit: 2015-10-29 | Discharge: 2015-10-29 | Disposition: A | Discharge status: Home / Self Care | Payer: No Typology Code available for payment source | Attending: Emergency Medicine | Admitting: Emergency Medicine

## 2015-10-29 ENCOUNTER — Emergency Department (HOSPITAL_BASED_OUTPATIENT_CLINIC_OR_DEPARTMENT_OTHER): Payer: No Typology Code available for payment source

## 2015-10-29 DIAGNOSIS — M25561 Pain in right knee: Secondary | ICD-10-CM | POA: Insufficient documentation

## 2015-10-29 DIAGNOSIS — F17200 Nicotine dependence, unspecified, uncomplicated: Secondary | ICD-10-CM | POA: Insufficient documentation

## 2015-10-29 MED ORDER — NAPROXEN 500 MG PO TABS: 500.0000 mg | ORAL_TABLET | Freq: Two times a day (BID) | ORAL | Status: DC

## 2015-10-29 MED ORDER — NAPROXEN 250 MG PO TABS
500.0000 mg | ORAL_TABLET | Freq: Once | ORAL | Status: AC
  Administered 2015-10-29: 500 mg via ORAL
  Filled 2015-10-29: qty 2

## 2015-10-29 MED FILL — NAPROXEN 500 MG TABLET: 500 | 15 days supply | Qty: 30 | Fill #0

## 2015-10-29 NOTE — ED Provider Notes (Signed)
CSN: [id number]     Arrival date & time 10/29/15  1005 History   First MD Initiated Contact with Patient 10/29/15 1021     Chief Complaint  Patient presents with  . Knee Pain     (Consider location/radiation/quality/duration/timing/severity/associated sxs/prior Treatment) The history is provided by the patient and medical records. No language interpreter was used.     Ray Johnson is a 20 y.o. male  with a hx of tonsillectomy presents to the Emergency Department complaining of intermittent but progressively worsening right knee pain onset 3 mos ago.  Pt reports the pain acutely worsened 48 hours ago.  Patient reports that he twisted his knee approximately 3 months ago and has intermittently had pain since that time. He reports the pain on Saturday began after swimming in the leg for many hours. He denies treatments prior to arrival. He has not attempted NSAIDs and has only used ice once. Patient reports pain with ambulation, nothing makes the symptoms better. He denies numbness, tingling, weakness, falls, known trauma, fever, chills.  History reviewed. No pertinent past medical history. Past Surgical History  Procedure Laterality Date  . Tonsillectomy     History reviewed. No pertinent family history. Social History  Substance Use Topics  . Smoking status: Current Some Day Smoker  . Smokeless tobacco: None  . Alcohol Use: No    Review of Systems  Constitutional: Negative for fever and chills.  Gastrointestinal: Negative for nausea and vomiting.  Musculoskeletal: Positive for joint swelling, arthralgias and gait problem (2/2 pain). Negative for back pain, neck pain and neck stiffness.  Skin: Negative for wound.  Neurological: Negative for numbness.  Hematological: Does not bruise/bleed easily.  Psychiatric/Behavioral: The patient is not nervous/anxious.   All other systems reviewed and are negative.     Allergies  Review of patient's allergies indicates no known  allergies.  Home Medications   Prior to Admission medications   Medication Sig Start Date End Date Taking? Authorizing Provider  naproxen (NAPROSYN) 500 MG tablet Take 1 tablet (500 mg total) by mouth 2 (two) times daily with a meal. 10/29/15   Mathea Frieling, PA-C   BP 132/78 mmHg  Pulse 87  Temp(Src) 98.1 F (36.7 C) (Oral)  Resp 18  Ht 5\' 6"  (1.676 m)  Wt 101.606 kg  BMI 36.17 kg/m2  SpO2 100% Physical Exam  Constitutional: He appears well-developed and well-nourished. No distress.  HENT:  Head: Normocephalic and atraumatic.  Eyes: Conjunctivae are normal.  Neck: Normal range of motion.  Cardiovascular: Normal rate, regular rhythm and intact distal pulses.   Capillary refill < 3 sec  Pulmonary/Chest: Effort normal and breath sounds normal.  Musculoskeletal: He exhibits tenderness. He exhibits no edema.  Right knee: Full range of motion, no joint line tenderness, mild, generalized tenderness over the entirety of the knee and popliteal fossa, no large joint effusion, no erythema or increased warmth Sensation intact to the entire right lower extremity Strength 5/5 with flexion and extension of the right knee, dorsiflexion and plantar flexion of the right ankle and flexion, extension, abduction and adduction of the right hip Antalgic gait but patient is able to weight-bear  Neurological: He is alert. Coordination normal.  Skin: Skin is warm and dry. He is not diaphoretic.  No tenting of the skin  Psychiatric: He has a normal mood and affect.  Nursing note and vitals reviewed.   ED Course  Procedures (including critical care time)  Imaging Review Dg Knee Complete 4 Views Right  10/29/2015  CLINICAL DATA:  Initial encounter for Pt having rt knee pain for a month after twisting,medial pain EXAM: RIGHT KNEE - COMPLETE 4+ VIEW COMPARISON:  None. FINDINGS: No acute fracture or dislocation. No joint effusion. Joint spaces maintained. IMPRESSION: No acute osseous abnormality.  Electronically Signed   By: Jeronimo Greaves M.D.   On: 10/29/2015 10:54   I have personally reviewed and evaluated these images and lab results as part of my medical decision-making.    MDM   Final diagnoses:  Arthralgia of right knee   BODEE LAFOE presents with acute exacerbation of intermittent right knee pain. History and physical are not consistent with septic joint. Patient is able to weight-bear.  Patient X-Ray negative for obvious fracture or dislocation. Pain managed in ED. Pt advised to follow up with orthopedics if symptoms persist for possibility of missed fracture diagnosis. Patient given brace while in ED, conservative therapy recommended and discussed. Patient will be dc home & is agreeable with above plan.     Dahlia Client Kyung Muto, PA-C 10/29/15 1115  Loren Racer, MD 10/31/15 [phone]

## 2015-10-29 NOTE — ED Notes (Signed)
Pt with chronic knee problems, nothing diagnosed, reports on/off pain for last 3 months, Saturday knee was more painful than usual, only able to bear partial weight,

## 2015-10-29 NOTE — Discharge Instructions (Signed)

## 2017-06-17 IMAGING — CR DG KNEE COMPLETE 4+V*R*
4 series · 4 of 4 positions shown · non-contrast
Comparison: None.

CLINICAL DATA: Initial encounter for Pt having rt knee pain for a
month after twisting,medial pain

EXAM:
RIGHT KNEE - COMPLETE 4+ VIEW

[t knee ap right]
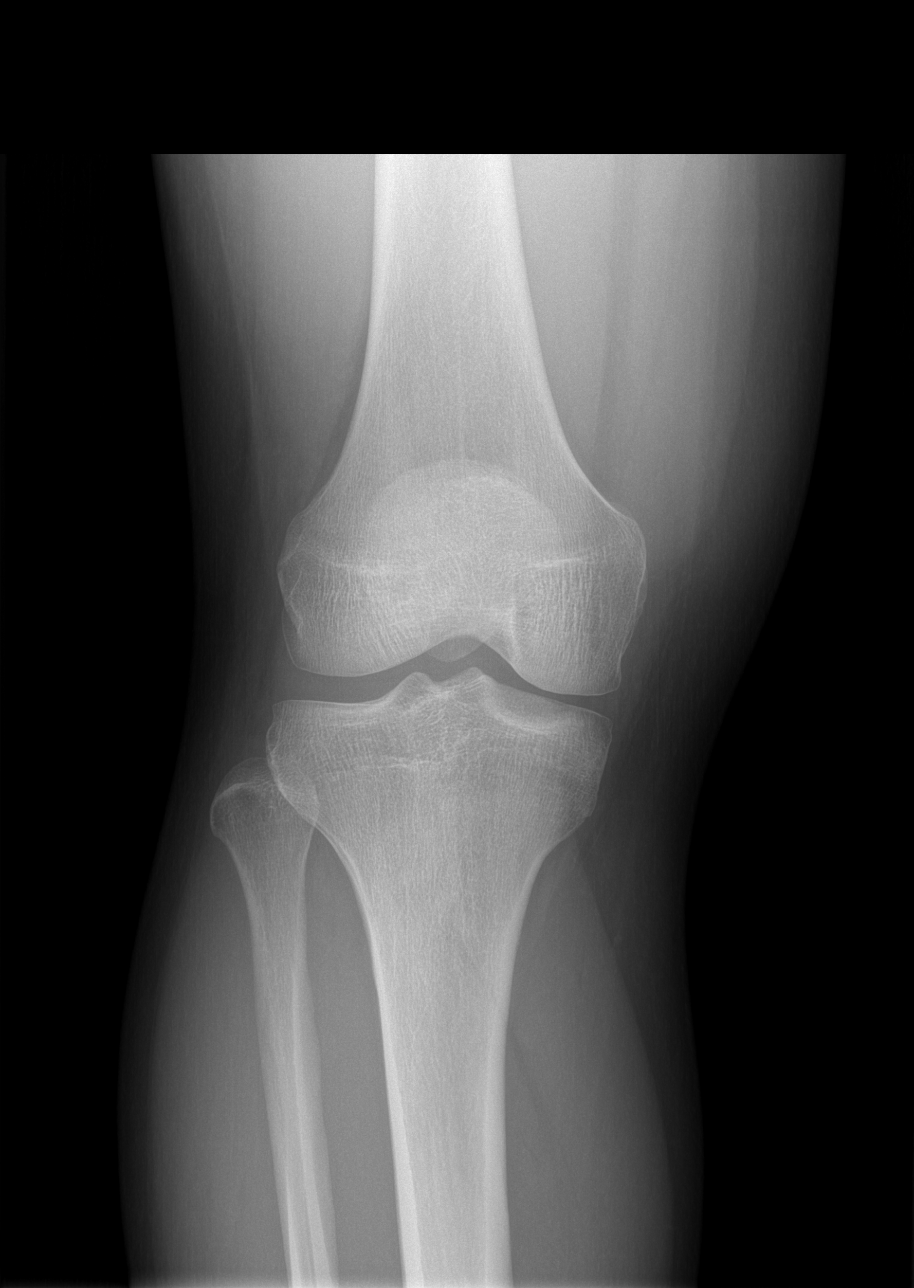

[t knee oblique right (1 of 2)]
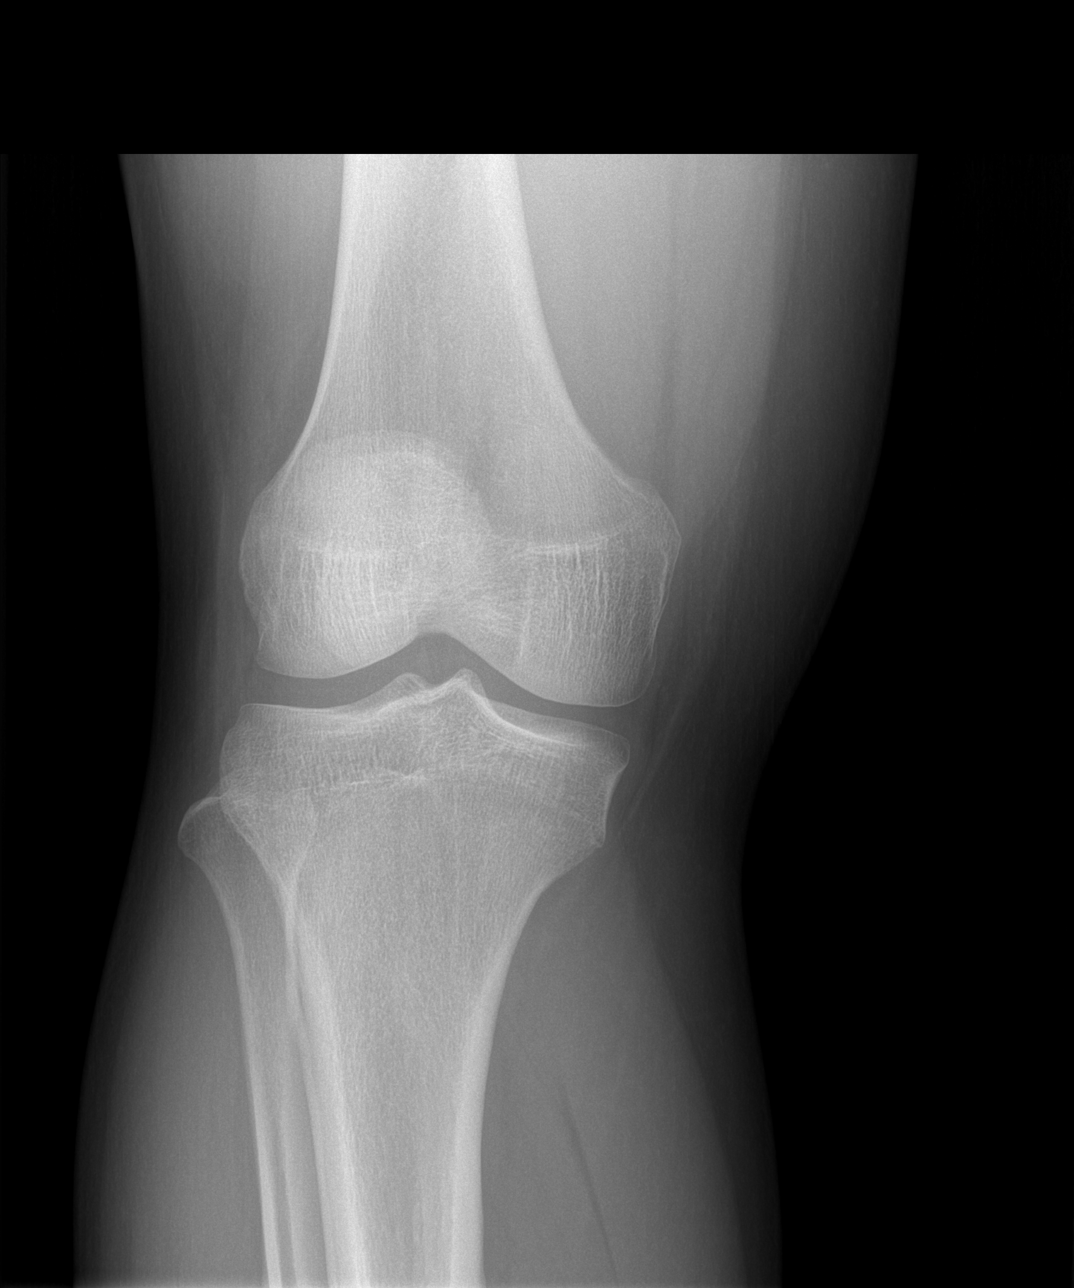

[t knee oblique right (2 of 2)]
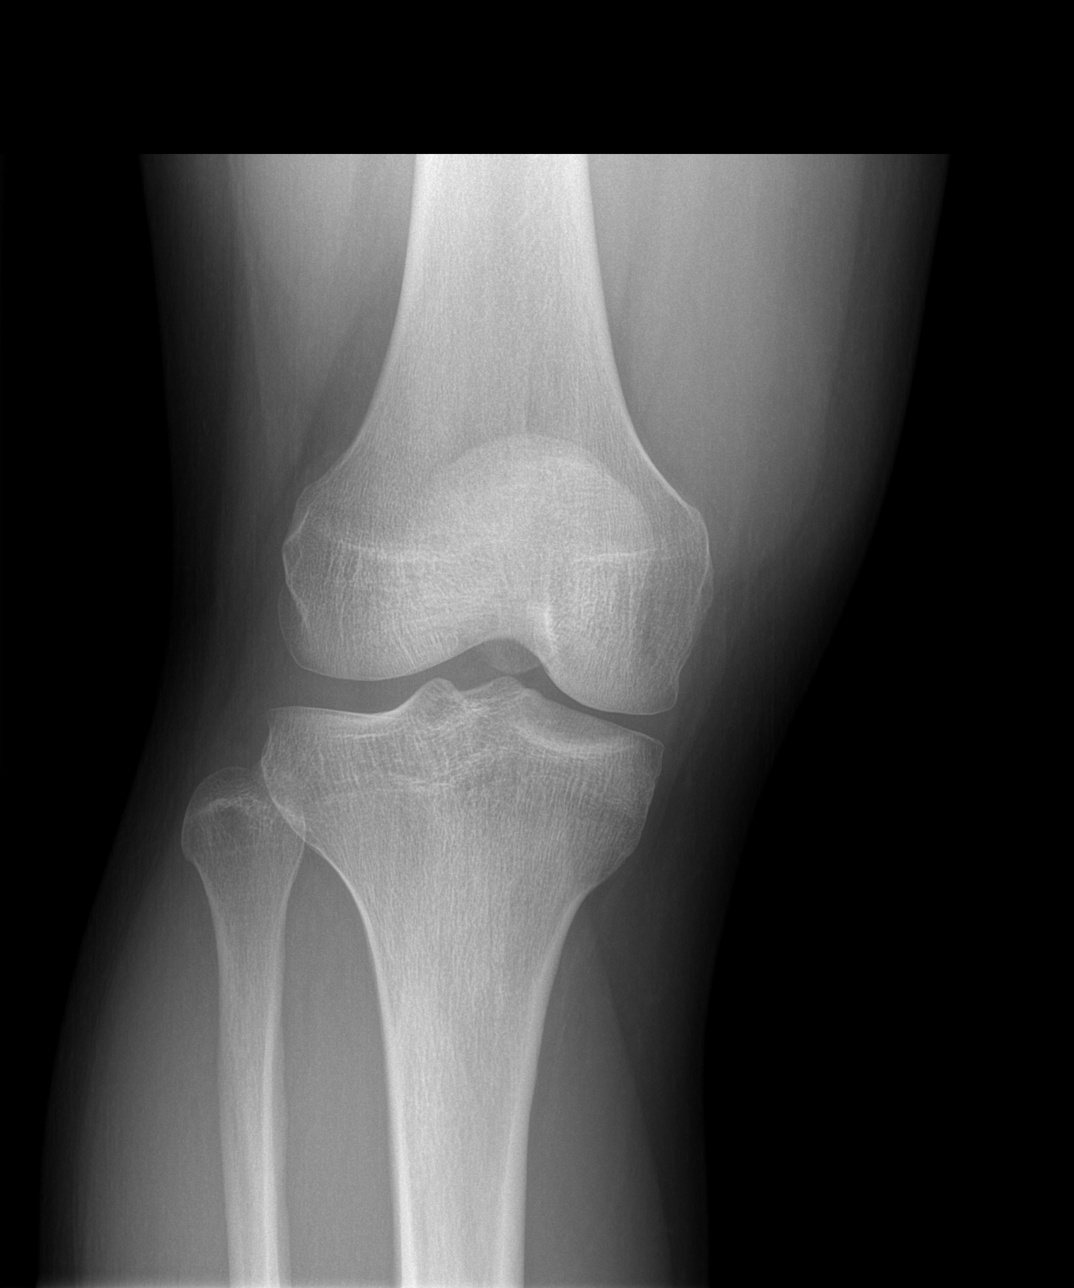

[t knee lat right]
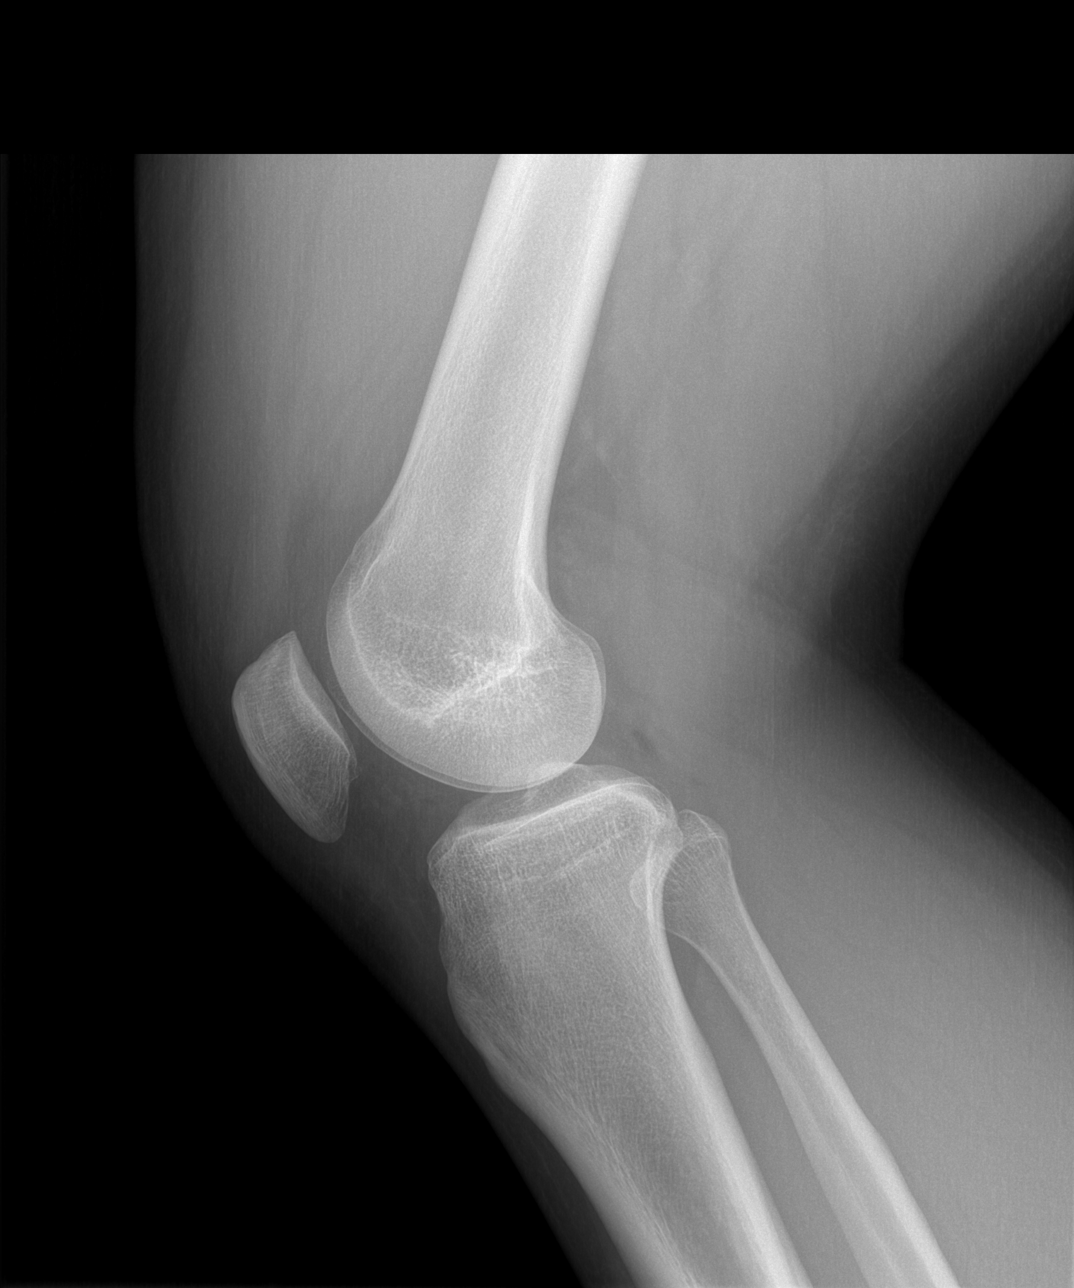

[4 of 4 positions shown; findings below may reference images not displayed]

FINDINGS: No acute fracture or dislocation. No joint effusion. Joint spaces
maintained.
IMPRESSION: No acute osseous abnormality.

## 2019-09-11 ENCOUNTER — Encounter (HOSPITAL_BASED_OUTPATIENT_CLINIC_OR_DEPARTMENT_OTHER): Payer: Self-pay | Admitting: *Deleted

## 2019-09-11 ENCOUNTER — Emergency Department (HOSPITAL_BASED_OUTPATIENT_CLINIC_OR_DEPARTMENT_OTHER)
Admission: EM | Admit: 2019-09-11 | Discharge: 2019-09-11 | Disposition: A | Discharge status: Home / Self Care | Payer: BC Managed Care – PPO | Attending: Emergency Medicine | Admitting: Emergency Medicine

## 2019-09-11 ENCOUNTER — Other Ambulatory Visit: Payer: Self-pay

## 2019-09-11 DIAGNOSIS — Z87891 Personal history of nicotine dependence: Secondary | ICD-10-CM | POA: Insufficient documentation

## 2019-09-11 DIAGNOSIS — Y9289 Other specified places as the place of occurrence of the external cause: Secondary | ICD-10-CM | POA: Diagnosis not present

## 2019-09-11 DIAGNOSIS — Y9389 Activity, other specified: Secondary | ICD-10-CM | POA: Diagnosis not present

## 2019-09-11 DIAGNOSIS — S61411A Laceration without foreign body of right hand, initial encounter: Secondary | ICD-10-CM | POA: Insufficient documentation

## 2019-09-11 DIAGNOSIS — W260XXA Contact with knife, initial encounter: Secondary | ICD-10-CM | POA: Diagnosis not present

## 2019-09-11 DIAGNOSIS — Y999 Unspecified external cause status: Secondary | ICD-10-CM | POA: Diagnosis not present

## 2019-09-11 NOTE — ED Triage Notes (Signed)
Pt presents with laceration to palm of right hand from a new knife he was trying to remove the cover from while looking at it in a store. Bandaid in place and bleeding controlled

## 2019-09-11 NOTE — ED Provider Notes (Signed)
[facility] EMERGENCY DEPARTMENT Provider Note   CSN: [id number] Arrival date & time: 09/11/19  2020     History Chief Complaint  Patient presents with  . Laceration    Ray Johnson is a 24 y.o. male.  Patient is a 24 year old male presenting with complaints of a hand laceration.  He was taking a sleeve off of a knife when the sharp edge sliced his hand.  Bleeding controlled with direct pressure.  Last tetanus was 5 years ago.  The history is provided by the patient.  Laceration Location:  Hand Hand laceration location:  R hand Depth:  Through dermis Quality: straight   Relieved by:  Nothing Worsened by:  Nothing Ineffective treatments:  None tried      History reviewed. No pertinent past medical history.  There are no problems to display for this patient.   Past Surgical History:  Procedure Laterality Date  . TONSILLECTOMY         No family history on file.  Social History   Tobacco Use  . Smoking status: Former Games developer  . Smokeless tobacco: Former Engineer, water Use Topics  . Alcohol use: No  . Drug use: No    Home Medications Prior to Admission medications   Medication Sig Start Date End Date Taking? Authorizing Provider  naproxen (NAPROSYN) 500 MG tablet Take 1 tablet (500 mg total) by mouth 2 (two) times daily with a meal. 10/29/15   Muthersbaugh, Dahlia Client, PA-C    Allergies    Patient has no known allergies.  Review of Systems   Review of Systems  All other systems reviewed and are negative.   Physical Exam Updated Vital Signs BP 126/77 (BP Location: Right Arm)   Pulse 81   Temp 98.2 F (36.8 C) (Oral)   Resp 14   Ht 5\' 6"  (1.676 m)   Wt 108.9 kg   SpO2 99%   BMI 38.74 kg/m   Physical Exam Vitals and nursing note reviewed.  Constitutional:      General: He is not in acute distress.    Appearance: Normal appearance. He is not ill-appearing.  HENT:     Head: Normocephalic and atraumatic.  Pulmonary:     Effort:  Pulmonary effort is normal.  Musculoskeletal:     Comments: There is a 2 cm superficial laceration to the lateral aspect of the palm of the right hand.  It extends into, but not through the dermis.  There are no exposed subcutaneous tissues or tendons.  He has full range of motion of the fifth finger.  Skin:    General: Skin is warm and dry.  Neurological:     Mental Status: He is alert.     ED Results / Procedures / Treatments   Labs (all labs ordered are listed, but only abnormal results are displayed) Labs Reviewed - No data to display  EKG None  Radiology No results found.  Procedures Procedures (including critical care time)  Medications Ordered in ED Medications - No data to display  ED Course  I have reviewed the triage vital signs and the nursing notes.  Pertinent labs & imaging results that were available during my care of the patient were reviewed by me and considered in my medical decision making (see chart for details).    MDM Rules/Calculators/A&P  Patient with a relatively superficial laceration through the palm of the right hand sustained with a knife.  Patient given the option of sutures versus local wound care and  dressing changes.  He is opted for the latter.  I feel as though the wound will heal fine without repair.  He is to apply bacitracin and dressing changes twice daily and to return to the ER for any problems.  Final Clinical Impression(s) / ED Diagnoses Final diagnoses:  None    Rx / DC Orders ED Discharge Orders    None       Veryl Speak, MD 09/11/19 2209

## 2019-09-11 NOTE — Discharge Instructions (Addendum)
Local wound care with bacitracin and dressing changes twice daily.  Return to the emergency department if you develop increased redness, pain, pus draining from the wound, or other new and concerning symptoms.

## 2023-06-03 ENCOUNTER — Encounter: Payer: Self-pay | Admitting: Emergency Medicine

## 2023-06-03 ENCOUNTER — Ambulatory Visit
Admission: EM | Admit: 2023-06-03 | Discharge: 2023-06-03 | Disposition: A | Discharge status: Home / Self Care | Payer: Managed Care, Other (non HMO) | Attending: Family Medicine | Admitting: Family Medicine

## 2023-06-03 DIAGNOSIS — J111 Influenza due to unidentified influenza virus with other respiratory manifestations: Secondary | ICD-10-CM

## 2023-06-03 LAB — POCT INFLUENZA A/B
Influenza A, POC: NEGATIVE
Influenza B, POC: NEGATIVE

## 2023-06-03 LAB — POCT RAPID STREP A (OFFICE): Rapid Strep A Screen: NEGATIVE

## 2023-06-03 LAB — POC SARS CORONAVIRUS 2 AG -  ED: SARS Coronavirus 2 Ag: NEGATIVE

## 2023-06-03 MED ORDER — OSELTAMIVIR PHOSPHATE 75 MG PO CAPS: 75.0000 mg | ORAL_CAPSULE | Freq: Two times a day (BID) | ORAL | 0 refills | Status: DC

## 2023-06-03 NOTE — Discharge Instructions (Signed)
Take plain guaifenesin (1200mg  extended release tabs such as Mucinex) twice daily, with plenty of water, for cough and congestion.   Get plenty of rest.   May take Delsym Cough Suppressant ("12 Hour Cough Relief") at bedtime for nighttime cough.  Stop Dayquil and Nyquil. May take Ibuprofen or Tylenol for body aches, fever, etc.

## 2023-06-03 NOTE — ED Provider Notes (Signed)
[facility]    CSN: [id number] Arrival date & time: 06/03/23  1138      History   Chief Complaint Chief Complaint  Patient presents with   Cough    HPI Ray Johnson is a 28 y.o. male.   Yesterday patient suddenly developed sore throat, myalgias, cough, fatigue, and fever to 104.  He denies pleuritic pain and shortness of breath.  His brother was diagnosed with influenza four days ago.  The history is provided by the patient.    History reviewed. No pertinent past medical history.  There are no active problems to display for this patient.   Past Surgical History:  Procedure Laterality Date   TONSILLECTOMY         Home Medications    Prior to Admission medications   Medication Sig Start Date End Date Taking? Authorizing Provider  oseltamivir (TAMIFLU) 75 MG capsule Take 1 capsule (75 mg total) by mouth every 12 (twelve) hours. 06/03/23  Yes Lattie Haw, MD  naproxen (NAPROSYN) 500 MG tablet Take 1 tablet (500 mg total) by mouth 2 (two) times daily with a meal. 10/29/15   Muthersbaugh, Dahlia Client, PA-C    Family History Family History  Problem Relation Age of Onset   Healthy Mother     Social History Social History   Tobacco Use   Smoking status: Former   Smokeless tobacco: Current  Vaping Use   Vaping status: Every Day  Substance Use Topics   Alcohol use: No   Drug use: No     Allergies   Patient has no known allergies.   Review of Systems Review of Systems + sore throat + cough No pleuritic pain No wheezing + nasal congestion + post-nasal drainage No sinus pain/pressure No itchy/red eyes No earache No hemoptysis No SOB + fever No nausea No vomiting No abdominal pain No diarrhea No urinary symptoms No skin rash + fatigue + myalgias No headache Used OTC meds (Dayquil, Nyquil) without relief   Physical Exam Triage Vital Signs ED Triage Vitals  Encounter Vitals Group     BP 06/03/23 1154 129/88      Systolic BP Percentile --      Diastolic BP Percentile --      Pulse Rate 06/03/23 1154 94     Resp 06/03/23 1154 18     Temp 06/03/23 1156 97.6 F (36.4 C)     Temp Source 06/03/23 1156 Oral     SpO2 06/03/23 1154 98 %     Weight 06/03/23 1156 250 lb (113.4 kg)     Height 06/03/23 1156 5\' 6"  (1.676 m)     Head Circumference --      Peak Flow --      Pain Score 06/03/23 1156 6     Pain Loc --      Pain Education --      Exclude from Growth Chart --    No data found.  Updated Vital Signs BP 129/88 (BP Location: Left Arm)   Pulse 94   Temp 97.6 F (36.4 C) (Oral)   Resp 18   Ht 5\' 6"  (1.676 m)   Wt 113.4 kg   SpO2 98%   BMI 40.35 kg/m   Visual Acuity Right Eye Distance:   Left Eye Distance:   Bilateral Distance:    Right Eye Near:   Left Eye Near:    Bilateral Near:     Physical Exam Nursing notes and Vital Signs reviewed. Appearance:  Patient  appears stated age, and in no acute distress Eyes:  Pupils are equal, round, and reactive to light and accomodation.  Extraocular movement is intact.  Conjunctivae are not inflamed  Ears:  Canals normal.  Tympanic membranes normal.  Nose:  Mildly congested turbinates.  No sinus tenderness.  Pharynx:  Normal Neck:  Supple.  No adenopathy.  Lungs:  Clear to auscultation.  Breath sounds are equal.  Moving air well. Heart:  Regular rate and rhythm without murmurs, rubs, or gallops.  Abdomen:  Nontender without masses or hepatosplenomegaly.  Bowel sounds are present.  No CVA or flank tenderness.  Extremities:  No edema.  Skin:  No rash present.   UC Treatments / Results  Labs (all labs ordered are listed, but only abnormal results are displayed) Labs Reviewed  POCT INFLUENZA A/B negative  POC SARS CORONAVIRUS 2 AG -  ED negative  POCT RAPID STREP A (OFFICE) negative    EKG   Radiology No results found.  Procedures Procedures (including critical care time)  Medications Ordered in UC Medications - No data to  display  Initial Impression / Assessment and Plan / UC Course  I have reviewed the triage vital signs and the nursing notes.  Pertinent labs & imaging results that were available during my care of the patient were reviewed by me and considered in my medical decision making (see chart for details).    Note that brother was diagnosed with influenza four days ago.  Begin empiric Tamiflu.  Followup with Family Doctor if not improved in one week.     Final Clinical Impressions(s) / UC Diagnoses   Final diagnoses:  Influenza-like illness     Discharge Instructions      Take plain guaifenesin (1200mg  extended release tabs such as Mucinex) twice daily, with plenty of water, for cough and congestion.   Get plenty of rest.   May take Delsym Cough Suppressant ("12 Hour Cough Relief") at bedtime for nighttime cough.  Stop Dayquil and Nyquil. May take Ibuprofen or Tylenol for body aches, fever, etc.      ED Prescriptions     Medication Sig Dispense Auth. Provider   oseltamivir (TAMIFLU) 75 MG capsule Take 1 capsule (75 mg total) by mouth every 12 (twelve) hours. 10 capsule Lattie Haw, MD         Lattie Haw, MD 06/04/23 [phone]

## 2023-06-03 NOTE — ED Triage Notes (Signed)
Patient here for sore throat, fever, bodyaches, cough, nasal drainage x 1 day.  Dayquil, Nyquil and Ibuprofen.

## 2024-04-27 ENCOUNTER — Other Ambulatory Visit: Payer: Self-pay

## 2024-04-27 ENCOUNTER — Ambulatory Visit
Admission: EM | Admit: 2024-04-27 | Discharge: 2024-04-27 | Disposition: A | Discharge status: Home / Self Care | Attending: Family Medicine | Admitting: Family Medicine

## 2024-04-27 ENCOUNTER — Ambulatory Visit: Payer: Self-pay

## 2024-04-27 DIAGNOSIS — Z9189 Other specified personal risk factors, not elsewhere classified: Secondary | ICD-10-CM

## 2024-04-27 DIAGNOSIS — J111 Influenza due to unidentified influenza virus with other respiratory manifestations: Secondary | ICD-10-CM | POA: Diagnosis not present

## 2024-04-27 LAB — POCT INFLUENZA A/B
Influenza A, POC: NEGATIVE
Influenza B, POC: NEGATIVE

## 2024-04-27 LAB — POC SOFIA SARS ANTIGEN FIA: SARS Coronavirus 2 Ag: NEGATIVE

## 2024-04-27 MED ORDER — OSELTAMIVIR PHOSPHATE 75 MG PO CAPS: 75.0000 mg | ORAL_CAPSULE | Freq: Two times a day (BID) | ORAL | 0 refills | Status: AC

## 2024-04-27 NOTE — Discharge Instructions (Signed)
 Take Tamiflu  2 times a day Take Tylenol  or ibuprofen  as needed for pain and fever Take over-the-counter cough or cold medicine as needed Drink lots of fluids Call for problems

## 2024-04-27 NOTE — ED Provider Notes (Signed)
 [facility]    CSN: [id number] Arrival date & time: 04/27/24  1713      History   Chief Complaint Chief Complaint  Patient presents with   Cough   Fever    HPI Ray Johnson is a 28 y.o. male.   HPI Daughter was diagnosed with influenza A on Monday.  On today Wednesday he developed fever chills body aches and cough.  His flu test is negative.  With direct exposure I think treatment for flu is appropriate. History reviewed. No pertinent past medical history.  There are no active problems to display for this patient.   Past Surgical History:  Procedure Laterality Date   TONSILLECTOMY         Home Medications    Prior to Admission medications  Medication Sig Start Date End Date Taking? Authorizing Provider  oseltamivir  (TAMIFLU ) 75 MG capsule Take 1 capsule (75 mg total) by mouth every 12 (twelve) hours. 04/27/24   Maranda Jamee Jacob, MD    Family History Family History  Problem Relation Age of Onset   Healthy Mother     Social History Social History[1]   Allergies   Patient has no known allergies.   Review of Systems Review of Systems See HPI  Physical Exam Triage Vital Signs ED Triage Vitals  Encounter Vitals Group     BP 04/27/24 1723 112/68     Girls Systolic BP Percentile --      Girls Diastolic BP Percentile --      Boys Systolic BP Percentile --      Boys Diastolic BP Percentile --      Pulse Rate 04/27/24 1723 (!) 112     Resp 04/27/24 1723 16     Temp 04/27/24 1723 99.2 F (37.3 C)     Temp src --      SpO2 04/27/24 1723 97 %     Weight --      Height --      Head Circumference --      Peak Flow --      Pain Score 04/27/24 1726 8     Pain Loc --      Pain Education --      Exclude from Growth Chart --    No data found.  Updated Vital Signs BP 112/68   Pulse (!) 112   Temp 99.2 F (37.3 C)   Resp 16   SpO2 97%       Physical Exam Constitutional:      General: He is not in acute distress.     Appearance: He is well-developed. He is ill-appearing.  HENT:     Head: Normocephalic and atraumatic.     Mouth/Throat:     Pharynx: Posterior oropharyngeal erythema present.  Eyes:     Conjunctiva/sclera: Conjunctivae normal.     Pupils: Pupils are equal, round, and reactive to light.  Cardiovascular:     Rate and Rhythm: Normal rate and regular rhythm.     Heart sounds: Normal heart sounds.  Pulmonary:     Effort: Pulmonary effort is normal. No respiratory distress.     Breath sounds: Normal breath sounds.  Musculoskeletal:        General: Normal range of motion.     Cervical back: Normal range of motion.  Skin:    General: Skin is warm and dry.  Neurological:     Mental Status: He is alert.      UC Treatments / Results  Labs (  all labs ordered are listed, but only abnormal results are displayed) Labs Reviewed  POC SOFIA SARS ANTIGEN FIA  POCT INFLUENZA A/B    EKG   Radiology No results found.  Procedures Procedures (including critical care time)  Medications Ordered in UC Medications - No data to display  Initial Impression / Assessment and Plan / UC Course  I have reviewed the triage vital signs and the nursing notes.  Pertinent labs & imaging results that were available during my care of the patient were reviewed by me and considered in my medical decision making (see chart for details).     Final Clinical Impressions(s) / UC Diagnoses   Final diagnoses:  Influenza-like illness  At increased risk for exposure to influenza virus     Discharge Instructions      Take Tamiflu  2 times a day Take Tylenol  or ibuprofen  as needed for pain and fever Take over-the-counter cough or cold medicine as needed Drink lots of fluids Call for problems   ED Prescriptions     Medication Sig Dispense Auth. Provider   oseltamivir  (TAMIFLU ) 75 MG capsule Take 1 capsule (75 mg total) by mouth every 12 (twelve) hours. 10 capsule Maranda Jamee Jacob, MD      PDMP  not reviewed this encounter.    [1]  Social History Tobacco Use   Smoking status: Former   Smokeless tobacco: Current  Vaping Use   Vaping status: Every Day  Substance Use Topics   Alcohol use: No   Drug use: No     Maranda Jamee Jacob, MD 04/27/24 [phone]

## 2024-04-27 NOTE — ED Triage Notes (Addendum)
 C/o cough, body aches, ha, fever, chills since earlier today. Felt heavy in chest last night. Took one of daughter's breathing tx last night. Has had tylenol . Did not check temperature.
